# Patient Record
Sex: Male | Born: 1990 | Race: Black or African American | Hispanic: No | Marital: Single | Smoking: Current every day smoker
Health system: Southern US, Community
[De-identification: ages and names within clinical notes are randomized; demographics above are authoritative.]

## PROBLEM LIST (undated history)

## (undated) DIAGNOSIS — I10 Essential (primary) hypertension: Secondary | ICD-10-CM

---
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
## 2012-04-12 ENCOUNTER — Emergency Department: Payer: Self-pay | Admitting: Emergency Medicine

## 2016-08-16 ENCOUNTER — Ambulatory Visit (HOSPITAL_COMMUNITY)
Admission: EM | Admit: 2016-08-16 | Discharge: 2016-08-16 | Disposition: A | Discharge status: Home / Self Care | Payer: Self-pay | Attending: Internal Medicine | Admitting: Internal Medicine

## 2016-08-16 ENCOUNTER — Encounter (HOSPITAL_COMMUNITY): Payer: Self-pay | Admitting: Emergency Medicine

## 2016-08-16 DIAGNOSIS — J019 Acute sinusitis, unspecified: Secondary | ICD-10-CM

## 2016-08-16 HISTORY — DX: Essential (primary) hypertension: I10

## 2016-08-16 MED ORDER — PREDNISONE 50 MG PO TABS: 50.0000 mg | ORAL_TABLET | Freq: Every day | ORAL | 0 refills | Status: DC

## 2016-08-16 MED ORDER — AMOXICILLIN-POT CLAVULANATE 875-125 MG PO TABS: 1.0000 | ORAL_TABLET | Freq: Two times a day (BID) | ORAL | 0 refills | Status: AC

## 2016-08-16 NOTE — ED Provider Notes (Signed)
[facility]    CSN: [id number] Arrival date & time: 08/16/16  1655     History   Chief Complaint Chief Complaint  Patient presents with  . Dizziness    HPI Donald Hopkins is a 25 y.o. male.  He presents today with a two-week history of cough, mostly dry. In the last 24 hours he has developed headache, sensation of being off balance, and has vomited twice. He is not having diarrhea. No runny/congested nose, no sore throat. No fever. 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 Hypertension     History reviewed. No pertinent surgical history.     Home Medications    Prior to Admission medications   Medication Sig Start Date End Date Taking? Authorizing Provider  LISINOPRIL PO Take by mouth.   Yes Historical Provider, MD  amoxicillin-clavulanate (AUGMENTIN) 875-125 MG tablet Take 1 tablet by mouth 2 (two) times daily. 08/16/16 08/26/16  Eustace MooreLaura W Haset [place], MD  predniSONE (DELTASONE) 50 MG tablet Take 1 tablet (50 mg total) by mouth daily. 08/16/16   Eustace MooreLaura W Mason [place]leigh, MD    Family History History reviewed. No pertinent family history.  Social History Social History  Substance Use Topics  . Smoking status: Never Smoker  . Smokeless tobacco: Never Used  . Alcohol use No     Allergies   Patient has no known allergies.   Review of Systems Review of Systems  All other systems reviewed and are negative.    Physical Exam Triage Vital Signs ED Triage Vitals  Enc Vitals Group     BP 08/16/16 1723 125/70     Pulse Rate 08/16/16 1723 89     Resp 08/16/16 1723 18     Temp 08/16/16 1723 99.4 F (37.4 C)     Temp Source 08/16/16 1723 Oral     SpO2 08/16/16 1723 100 %     Weight --      Height --      Pain Score 08/16/16 1725 4   Updated Vital Signs BP 125/70 (BP Location: Left Arm)   Pulse 89   Temp 99.4 F (37.4 C) (Oral)   Resp 18   SpO2 100%  Physical Exam  Constitutional: He is oriented to person, place, and time.  No distress.  Alert, nicely groomed  HENT:  Head: Atraumatic.  Bilateral TMs are quite dull, left TM is red. Marked nasal congestion, particularly on the left Throat is quite red with postnasal drainage evident  Eyes:  Conjugate gaze, no eye redness/drainage  Neck: Neck supple.  Cardiovascular: Normal rate and regular rhythm.   Pulmonary/Chest: No respiratory distress. He has no wheezes. He has no rales.  Coarse but symmetric breath sounds throughout  Abdominal: He exhibits no distension.  Musculoskeletal: Normal range of motion.  Neurological: He is alert and oriented to person, place, and time.  Skin: Skin is warm and dry.  No cyanosis  Nursing note and vitals reviewed.    UC Treatments / Results   Procedures Procedures (including critical care time) None today  Final Clinical Impressions(s) / UC Diagnoses   Final diagnoses:  Acute sinusitis with symptoms > 10 days   Prescriptions for prednisone (for congestion/dizziness) and augmentin (antibiotic) were sent to the [facility] on [address].  Recheck for increasing phlegm production/nasal drainage or new fever >100.5.  Anticipate gradual improvement over the next several days.  Cough may persist for another couple weeks but should be improving.  New Prescriptions Discharge Medication List as of 08/16/2016  6:27 PM    START taking these medications   Details  amoxicillin-clavulanate (AUGMENTIN) 875-125 MG tablet Take 1 tablet by mouth 2 (two) times daily., Starting Sat 08/16/2016, Until Tue 08/26/2016, Normal    predniSONE (DELTASONE) 50 MG tablet Take 1 tablet (50 mg total) by mouth daily., Starting Sat 08/16/2016, Normal         Eustace MooreLaura W Christiane Sistare, MD 08/18/16 [phone]

## 2016-08-16 NOTE — ED Triage Notes (Signed)
Pt c/o feeling dizziness and nauseas onset this am associated w/HA  Believes it might be r/t to HTN... Taking lisinopril daily  A&O x4... NAD

## 2016-08-16 NOTE — Discharge Instructions (Addendum)
Prescriptions for prednisone (for congestion/dizziness) and augmentin (antibiotic) were sent to the [facility] on [address].  Recheck for increasing phlegm production/nasal drainage or new fever >100.5.  Anticipate gradual improvement over the next several days.  Cough may persist for another couple weeks but should be improving.

## 2017-05-13 ENCOUNTER — Emergency Department (HOSPITAL_BASED_OUTPATIENT_CLINIC_OR_DEPARTMENT_OTHER)
Admission: EM | Admit: 2017-05-13 | Discharge: 2017-05-13 | Disposition: A | Discharge status: Home / Self Care | Payer: Self-pay | Attending: Emergency Medicine | Admitting: Emergency Medicine

## 2017-05-13 ENCOUNTER — Encounter (HOSPITAL_BASED_OUTPATIENT_CLINIC_OR_DEPARTMENT_OTHER): Payer: Self-pay

## 2017-05-13 DIAGNOSIS — I10 Essential (primary) hypertension: Secondary | ICD-10-CM | POA: Insufficient documentation

## 2017-05-13 DIAGNOSIS — M25562 Pain in left knee: Secondary | ICD-10-CM | POA: Insufficient documentation

## 2017-05-13 MED ORDER — OXYCODONE HCL 5 MG PO TABS
5.0000 mg | ORAL_TABLET | Freq: Once | ORAL | Status: AC
  Administered 2017-05-13: 5 mg via ORAL
  Filled 2017-05-13: qty 1

## 2017-05-13 MED ORDER — ACETAMINOPHEN 500 MG PO TABS
1000.0000 mg | ORAL_TABLET | Freq: Once | ORAL | Status: AC
  Administered 2017-05-13: 1000 mg via ORAL
  Filled 2017-05-13: qty 2

## 2017-05-13 MED ORDER — KETOROLAC TROMETHAMINE 60 MG/2ML IM SOLN
15.0000 mg | Freq: Once | INTRAMUSCULAR | Status: AC
  Administered 2017-05-13: 15 mg via INTRAMUSCULAR
  Filled 2017-05-13: qty 2

## 2017-05-13 NOTE — ED Notes (Signed)
PMS intact before and after. Pt tolerated well. All questions answered. 

## 2017-05-13 NOTE — Discharge Instructions (Signed)
Take 4 over the counter ibuprofen tablets 3 times a day or 2 over-the-counter naproxen tablets twice a day for pain. Also take tylenol 1000mg(2 extra strength) four times a day.    

## 2017-05-13 NOTE — ED Triage Notes (Signed)
C/o pain/swelling to left knee x 1-2 weeks-hx of chronic pain since childhood-denies injury-NAD-steady gait

## 2017-05-13 NOTE — ED Provider Notes (Signed)
[facility]   CSN: [id number] Arrival date & time: 05/13/17  1103     History   Chief Complaint Chief Complaint  Patient presents with  . Knee Pain    HPI Donald Hopkins is a 26 y.o. male.  26 yo M with a chief complaint of left knee pain. This been going on for the past couple weeks. He denies trauma. Denies fevers or chills. Has had a history since he was about 26 years old where he would have spontaneous knee pain. Has never been diagnosed with anything. Pain is described as sharp and nonradiating. Has had difficulty walking on it.   The history is provided by the patient and a relative.  Knee Pain   This is a chronic problem. The current episode started more than 1 week ago. The problem occurs constantly. The problem has been gradually worsening. The pain is present in the left knee. The quality of the pain is described as sharp. The pain is at a severity of 8/10. The pain is severe. Associated symptoms include limited range of motion. He has tried nothing for the symptoms. The treatment provided no relief.    Past Medical History:  Diagnosis Date  . Hypertension     There are no active problems to display for this patient.   History reviewed. No pertinent surgical history.     Home Medications    Prior to Admission medications   Not on File    Family History No family history on file.  Social History Social History  Substance Use Topics  . Smoking status: Never Smoker  . Smokeless tobacco: Never Used  . Alcohol use No     Allergies   Patient has no known allergies.   Review of Systems Review of Systems  Constitutional: Negative for chills and fever.  HENT: Negative for congestion and facial swelling.   Eyes: Negative for discharge and visual disturbance.  Respiratory: Negative for shortness of breath.   Cardiovascular: Negative for chest pain and palpitations.  Gastrointestinal: Negative for abdominal pain, diarrhea and  vomiting.  Musculoskeletal: Positive for arthralgias, gait problem and myalgias.  Skin: Negative for color change and rash.  Neurological: Negative for tremors, syncope and headaches.  Psychiatric/Behavioral: Negative for confusion and dysphoric mood.     Physical Exam Updated Vital Signs BP (!) 151/99 (BP Location: Left Arm)   Pulse 97   Temp 98.1 F (36.7 C) (Oral)   Resp 16   Ht 6\' 1"  (1.854 m)   Wt 122.5 kg (270 lb)   SpO2 99%   BMI 35.62 kg/m   Physical Exam  Constitutional: He is oriented to person, place, and time. He appears well-developed and well-nourished.  HENT:  Head: Normocephalic and atraumatic.  Eyes: Pupils are equal, round, and reactive to light. EOM are normal.  Neck: Normal range of motion. Neck supple. No JVD present.  Cardiovascular: Normal rate and regular rhythm.  Exam reveals no gallop and no friction rub.   No murmur heard. Pulmonary/Chest: No respiratory distress. He has no wheezes.  Abdominal: He exhibits no distension and no mass. There is no tenderness. There is no rebound and no guarding.  Musculoskeletal: He exhibits edema and tenderness.  Mild pain with ROM.  PMS intact distally. Edema without significant erythema or warmth.   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  Skin: No rash noted. No pallor.  Psychiatric:  He has a normal mood and affect. His behavior is normal.  Nursing note and vitals reviewed.    ED Treatments / Results  Labs (all labs ordered are listed, but only abnormal results are displayed) Labs Reviewed - No data to display  EKG  EKG Interpretation None       Radiology No results found.  Procedures Procedures (including critical care time)  Medications Ordered in ED Medications  ketorolac (TORADOL) injection 15 mg (not administered)  acetaminophen (TYLENOL) tablet 1,000 mg (1,000 mg Oral Given 05/13/17 1132)  oxyCODONE (Oxy  IR/ROXICODONE) immediate release tablet 5 mg (5 mg Oral Given 05/13/17 1132)     Initial Impression / Assessment and Plan / ED Course  I have reviewed the triage vital signs and the nursing notes.  Pertinent labs & imaging results that were available during my care of the patient were reviewed by me and considered in my medical decision making (see chart for details).     26 yo M With a chief complaint of left knee pain. Going on for the past couple weeks. There is some significant edema but I'm able to range it without much difficulty. I doubt septic arthritis at this time. We'll place in a knee immobilizer. Crutches. Ortho Follow-up.  11:33 AM:  I have discussed the diagnosis/risks/treatment options with the patient and family and believe the pt to be eligible for discharge home to follow-up with PCP. We also discussed returning to the ED immediately if new or worsening sx occur. We discussed the sx which are most concerning (e.g., sudden worsening pain, fever, inability to tolerate by mouth) that necessitate immediate return. Medications administered to the patient during their visit and any new prescriptions provided to the patient are listed below.  Medications given during this visit Medications  ketorolac (TORADOL) injection 15 mg (not administered)  acetaminophen (TYLENOL) tablet 1,000 mg (1,000 mg Oral Given 05/13/17 1132)  oxyCODONE (Oxy IR/ROXICODONE) immediate release tablet 5 mg (5 mg Oral Given 05/13/17 1132)     The patient appears reasonably screen and/or stabilized for discharge and I doubt any other medical condition or other [facility] requiring further screening, evaluation, or treatment in the ED at this time prior to discharge.    Final Clinical Impressions(s) / ED Diagnoses   Final diagnoses:  Acute pain of left knee    New Prescriptions New Prescriptions   No medications on file     Melene Plan, DO 05/13/17 1133

## 2017-05-17 ENCOUNTER — Emergency Department (HOSPITAL_BASED_OUTPATIENT_CLINIC_OR_DEPARTMENT_OTHER): Payer: Self-pay

## 2017-05-17 ENCOUNTER — Encounter (HOSPITAL_BASED_OUTPATIENT_CLINIC_OR_DEPARTMENT_OTHER): Payer: Self-pay | Admitting: Emergency Medicine

## 2017-05-17 ENCOUNTER — Emergency Department (HOSPITAL_BASED_OUTPATIENT_CLINIC_OR_DEPARTMENT_OTHER)
Admission: EM | Admit: 2017-05-17 | Discharge: 2017-05-17 | Disposition: A | Discharge status: Home / Self Care | Payer: Self-pay | Attending: Emergency Medicine | Admitting: Emergency Medicine

## 2017-05-17 DIAGNOSIS — I10 Essential (primary) hypertension: Secondary | ICD-10-CM | POA: Insufficient documentation

## 2017-05-17 DIAGNOSIS — M25562 Pain in left knee: Secondary | ICD-10-CM | POA: Insufficient documentation

## 2017-05-17 MED ORDER — TRAMADOL HCL 50 MG PO TABS: 50.0000 mg | ORAL_TABLET | Freq: Four times a day (QID) | ORAL | 0 refills | Status: DC | PRN

## 2017-05-17 MED ORDER — PREDNISONE 50 MG PO TABS: 50.0000 mg | ORAL_TABLET | Freq: Every day | ORAL | 0 refills | Status: DC

## 2017-05-17 NOTE — Discharge Instructions (Signed)
Return here as needed.  Follow-up with the orthopedist provided.  Ice and elevate your knee °

## 2017-05-17 NOTE — ED Triage Notes (Signed)
Patient follows up from 3 days ago-reports pain in left knee has gotten worse.

## 2017-05-18 NOTE — ED Provider Notes (Signed)
[facility]   CSN: [id number] Arrival date & time: 05/17/17  1036     History   Chief Complaint Chief Complaint  Patient presents with  . Knee Pain    HPI Donald Hopkins is a 25 y.o. male.  HPI Patient presents to the emergency department with knee pain over the last week.  Patient states he was seen 4 days ago and was placed in a knee immobilizer and told to follow with orthopedics.  Patient states that he did not have any injury to the knee.  States the pain is worse with movement and palpation.  Patient states that he did not take any medications prior to arrival.  Patient denies numbness or weakness in the leg.  The patient also denies fever Past Medical History:  Diagnosis Date  . Hypertension     There are no active problems to display for this patient.   History reviewed. No pertinent surgical history.     Home Medications    Prior to Admission medications   Medication Sig Start Date End Date Taking? Authorizing Provider  predniSONE (DELTASONE) 50 MG tablet Take 1 tablet (50 mg total) by mouth daily. 05/17/17   Loanne Emery, Cristal Deer, PA-C  traMADol (ULTRAM) 50 MG tablet Take 1 tablet (50 mg total) by mouth every 6 (six) hours as needed for severe pain. 05/17/17   Charlestine Night, PA-C    Family History History reviewed. No pertinent family history.  Social History Social History  Substance Use Topics  . Smoking status: Never Smoker  . Smokeless tobacco: Never Used  . Alcohol use No     Allergies   Patient has no known allergies.   Review of Systems Review of Systems  All other systems negative except as documented in the HPI. All pertinent positives and negatives as reviewed in the HPI. Physical Exam Updated Vital Signs BP 131/76   Pulse 69   Temp 99.1 F (37.3 C) (Oral)   Resp 18   Ht 6\' 1"  (1.854 m)   Wt 122.5 kg (270 lb)   SpO2 99%   BMI 35.62 kg/m   Physical Exam  Constitutional: He is oriented to person, place,  and time. He appears well-developed and well-nourished. No distress.  HENT:  Head: Normocephalic and atraumatic.  Eyes: Pupils are equal, round, and reactive to light.  Pulmonary/Chest: Effort normal.  Musculoskeletal:       Left knee: He exhibits swelling. He exhibits no deformity, no laceration and no erythema. Tenderness found.       Legs: Neurological: He is alert and oriented to person, place, and time.  Skin: Skin is warm and dry.  Psychiatric: He has a normal mood and affect.  Nursing note and vitals reviewed.    ED Treatments / Results  Labs (all labs ordered are listed, but only abnormal results are displayed) Labs Reviewed - No data to display  EKG  EKG Interpretation None       Radiology Dg Knee Complete 4 Views Left  Result Date: 05/17/2017 CLINICAL DATA:  Chronic left knee pain EXAM: LEFT KNEE - COMPLETE 4+ VIEW COMPARISON:  None. FINDINGS: No fracture or dislocation is seen. The joint spaces are preserved. The visualized soft tissues are unremarkable. IMPRESSION: Negative. Electronically Signed   By: Charline Bills M.D.   On: 05/17/2017 11:48    Procedures Procedures (including critical care time)  Medications Ordered in ED Medications - No data to display   Initial Impression / Assessment and Plan / ED  Course  I have reviewed the triage vital signs and the nursing notes.  Pertinent labs & imaging results that were available during my care of the patient were reviewed by me and considered in my medical decision making (see chart for details).     Do not feel that this is a septic joint.  I do feel this could represent an inflammatory process.  We will treat with medications that have a follow-up with orthopedics.  Told ice and elevate the knee.  The knee does not have any signs of abnormality on x-ray.  Patient agrees the plan and all questions were answered  Final Clinical Impressions(s) / ED Diagnoses   Final diagnoses:  Acute pain of left knee      New Prescriptions Discharge Medication List as of 05/17/2017  1:35 PM    START taking these medications   Details  predniSONE (DELTASONE) 50 MG tablet Take 1 tablet (50 mg total) by mouth daily., Starting Sun 05/17/2017, Print    traMADol (ULTRAM) 50 MG tablet Take 1 tablet (50 mg total) by mouth every 6 (six) hours as needed for severe pain., Starting Sun 05/17/2017, Print         Capria Cartaya, [place], PA-C 05/18/17 1711    Alvira Monday, MD 05/18/17 2322

## 2017-12-30 ENCOUNTER — Encounter (HOSPITAL_BASED_OUTPATIENT_CLINIC_OR_DEPARTMENT_OTHER): Payer: Self-pay | Admitting: Emergency Medicine

## 2017-12-30 ENCOUNTER — Other Ambulatory Visit: Payer: Self-pay

## 2017-12-30 ENCOUNTER — Emergency Department (HOSPITAL_BASED_OUTPATIENT_CLINIC_OR_DEPARTMENT_OTHER)
Admission: EM | Admit: 2017-12-30 | Discharge: 2017-12-30 | Disposition: A | Discharge status: Home / Self Care | Payer: Self-pay | Attending: Emergency Medicine | Admitting: Emergency Medicine

## 2017-12-30 ENCOUNTER — Emergency Department (HOSPITAL_BASED_OUTPATIENT_CLINIC_OR_DEPARTMENT_OTHER): Payer: Self-pay

## 2017-12-30 DIAGNOSIS — Y999 Unspecified external cause status: Secondary | ICD-10-CM | POA: Insufficient documentation

## 2017-12-30 DIAGNOSIS — W19XXXA Unspecified fall, initial encounter: Secondary | ICD-10-CM | POA: Insufficient documentation

## 2017-12-30 DIAGNOSIS — F17210 Nicotine dependence, cigarettes, uncomplicated: Secondary | ICD-10-CM | POA: Insufficient documentation

## 2017-12-30 DIAGNOSIS — I10 Essential (primary) hypertension: Secondary | ICD-10-CM | POA: Insufficient documentation

## 2017-12-30 DIAGNOSIS — S8392XA Sprain of unspecified site of left knee, initial encounter: Secondary | ICD-10-CM | POA: Insufficient documentation

## 2017-12-30 DIAGNOSIS — Y929 Unspecified place or not applicable: Secondary | ICD-10-CM | POA: Insufficient documentation

## 2017-12-30 DIAGNOSIS — Y939 Activity, unspecified: Secondary | ICD-10-CM | POA: Insufficient documentation

## 2017-12-30 MED ORDER — IBUPROFEN 800 MG PO TABS: 800.0000 mg | ORAL_TABLET | Freq: Three times a day (TID) | ORAL | 0 refills | Status: AC

## 2017-12-30 NOTE — ED Triage Notes (Signed)
Pt states he banged his L knee this morning on a piece of steel causing pain and difficulty ambulating.

## 2017-12-30 NOTE — ED Provider Notes (Signed)
[facility] EM[facility]ENC[facility] DEPARTMENT Provider Note   CSN: [id number] Arrival date & time: 12/30/17  0847     History   Chief Complaint Chief Complaint  Patient presents with  . Knee Injury    HPI Donald Hopkins is a 27 y.o. male.  Knee 'gave out' which it apparently has done multiple times in the past and he feel on  His left patella causing pain and swelling.   The history is provided by the patient.  Knee Pain   This is a new problem. The current episode started 1 to 2 hours ago. The problem occurs constantly. The problem has not changed since onset.The pain is present in the left knee. The quality of the pain is described as sharp and aching. The pain is mild. Associated symptoms include stiffness. He has tried nothing for the symptoms. The treatment provided no relief.    Past Medical History:  Diagnosis Date  . Hypertension     There are no active problems to display for this patient.   History reviewed. No pertinent surgical history.      Home Medications    Prior to Admission medications   Medication Sig Start Date End Date Taking? Authorizing Provider  ibuprofen (ADVIL,MOTRIN) 800 MG tablet Take 1 tablet (800 mg total) by mouth 3 (three) times daily. 12/30/17   Nicolaus Andel, Barbara CowerJason, MD    Family History No family history on file.  Social History Social History   Tobacco Use  . Smoking status: Current Every Day Smoker  . Smokeless tobacco: Never Used  Substance Use Topics  . Alcohol use: No  . Drug use: No     Allergies   Patient has no known allergies.   Review of Systems Review of Systems  Musculoskeletal: Positive for stiffness.  All other systems reviewed and are negative.    Physical Exam Updated Vital Signs BP 131/73 (BP Location: Right Arm)   Pulse 88   Temp 98.2 F (36.8 C) (Oral)   Resp 18   Ht 5\' 8"  (1.727 m)   Wt 131.5 kg (290 lb)   SpO2 97%   BMI 44.09 kg/m   Physical Exam  Constitutional: He appears well-developed  and well-nourished.  HENT:  Head: Normocephalic and atraumatic.  Eyes: Conjunctivae and EOM are normal.  Neck: Normal range of motion.  Cardiovascular: Normal rate.  Pulmonary/Chest: Effort normal. No respiratory distress.  Abdominal: Soft. Bowel sounds are normal. He exhibits no distension.  Musculoskeletal: Normal range of motion. He exhibits edema (mild in left knee) and tenderness (left patella). He exhibits no deformity.  Neurological: He is alert.  Skin: Skin is warm and dry.  Nursing note and vitals reviewed.    ED Treatments / Results  Labs (all labs ordered are listed, but only abnormal results are displayed) Labs Reviewed - No data to display  EKG None  Radiology Dg Knee Complete 4 Views Left  Result Date: 12/30/2017 CLINICAL DATA:  27 year old male status post fall on to left knee with pain and decreased range of motion. EXAM: LEFT KNEE - COMPLETE 4+ VIEW COMPARISON:  Left knee series 05/17/2017. FINDINGS: Bone mineralization is within normal limits. Joint spaces and alignment are stable and within normal limits. No evidence of fracture, dislocation, or joint effusion. No evidence of arthropathy or other focal bone abnormality. Soft tissues are unremarkable. IMPRESSION: Stable and negative. Electronically Signed   By: Odessa FlemingH  Hall M.D.   On: 12/30/2017 09:33    Procedures Procedures (including critical care time)  Medications Ordered in ED Medications - No data to display   Initial Impression / Assessment and Plan / ED Course  I have reviewed the triage vital signs and the nursing notes.  Pertinent labs & imaging results that were available during my care of the patient were reviewed by me and considered in my medical decision making (see chart for details).     No obvious traumatic injury today. Suspect possible ligamental issue 2/2 'giving out' and other chronic problems he has had. Will refer to orthopedics for that issue, otherwise RICE therapy for traumatic knee.    Final Clinical Impressions(s) / ED Diagnoses   Final diagnoses:  Sprain of left knee, unspecified ligament, initial encounter    ED Discharge Orders        Ordered    ibuprofen (ADVIL,MOTRIN) 800 MG tablet  3 times daily     12/30/17 0942       Ahjanae Cassel, Barbara Cower, MD 12/30/17 [phone]

## 2017-12-30 NOTE — ED Notes (Signed)
Patient transported to X-ray 

## 2018-05-16 ENCOUNTER — Emergency Department (HOSPITAL_BASED_OUTPATIENT_CLINIC_OR_DEPARTMENT_OTHER)
Admission: EM | Admit: 2018-05-16 | Discharge: 2018-05-16 | Disposition: A | Discharge status: Home / Self Care | Payer: Self-pay | Attending: Emergency Medicine | Admitting: Emergency Medicine

## 2018-05-16 ENCOUNTER — Encounter (HOSPITAL_BASED_OUTPATIENT_CLINIC_OR_DEPARTMENT_OTHER): Payer: Self-pay | Admitting: *Deleted

## 2018-05-16 ENCOUNTER — Other Ambulatory Visit: Payer: Self-pay

## 2018-05-16 ENCOUNTER — Emergency Department (HOSPITAL_BASED_OUTPATIENT_CLINIC_OR_DEPARTMENT_OTHER): Payer: Self-pay

## 2018-05-16 DIAGNOSIS — R0789 Other chest pain: Secondary | ICD-10-CM | POA: Insufficient documentation

## 2018-05-16 DIAGNOSIS — J069 Acute upper respiratory infection, unspecified: Secondary | ICD-10-CM | POA: Insufficient documentation

## 2018-05-16 DIAGNOSIS — F17210 Nicotine dependence, cigarettes, uncomplicated: Secondary | ICD-10-CM | POA: Insufficient documentation

## 2018-05-16 DIAGNOSIS — I10 Essential (primary) hypertension: Secondary | ICD-10-CM | POA: Insufficient documentation

## 2018-05-16 NOTE — Discharge Instructions (Addendum)
1.  You should have a family doctor.  Use the referral number in your discharge instructions to find one. 2.  You should monitor your blood pressure closely to make sure that you are in the normal range.  This should be done with the assistance of a physician.  For your overall health, quit smoking, have a healthy diet and get regular exercise.

## 2018-05-16 NOTE — ED Provider Notes (Signed)
[facility] EMERGENCY DEPARTMENT Provider Note   CSN: [id number] Arrival date & time: 05/16/18  2144     History   Chief Complaint Chief Complaint  Patient presents with  . Chest Pain    HPI Donald Hopkins is a 27 y.o. male.  HPI Patient reports he has had a cold for several days.  He has had some nasal congestion and drainage and sore throat.  No fever that he is aware of.  Tonight he got several sharp chest pains in the center of his chest.  Made him very worried.  He came to make sure there was nothing wrong with his heart.  She does smoke.  He reports he had a history of asthma as a child but seems to have outgrown it.  He has not needed an inhaler had wheezing for a long time. no Lower extremity swelling or calf pain.  Patient reports he has hypertension but does not take medications.  He reports sometimes these have really high numbers but he cannot remember when.  He does not get it routinely checked. Past Medical History:  Diagnosis Date  . Hypertension     There are no active problems to display for this patient.   History reviewed. No pertinent surgical history.      Home Medications    Prior to Admission medications   Medication Sig Start Date End Date Taking? Authorizing Provider  ibuprofen (ADVIL,MOTRIN) 800 MG tablet Take 1 tablet (800 mg total) by mouth 3 (three) times daily. 12/30/17   Mesner, Barbara Cower, MD    Family History No family history on file.  Social History Social History   Tobacco Use  . Smoking status: Current Every Day Smoker    Types: Cigarettes  . Smokeless tobacco: Never Used  Substance Use Topics  . Alcohol use: No  . Drug use: No     Allergies   Patient has no known allergies.   Review of Systems Review of Systems 10 Systems reviewed and are negative for acute change except as noted in the HPI.   Physical Exam Updated Vital Signs BP (!) 144/100   Pulse 78   Resp 19   Ht 5\' 7"  (1.702 m)   Wt 127 kg   SpO2 100%    BMI 43.85 kg/m   Physical Exam  Constitutional: He is oriented to person, place, and time. He appears well-developed and well-nourished.  HENT:  Head: Normocephalic and atraumatic.  Nose: Nose normal.  Mouth/Throat: Oropharynx is clear and moist.  Lateral TMs normal.  Eyes: Pupils are equal, round, and reactive to light. EOM are normal.  Neck: Neck supple.  Cardiovascular: Normal rate, regular rhythm, normal heart sounds and intact distal pulses.  Pulmonary/Chest: Effort normal and breath sounds normal. He exhibits tenderness.  Patient winces with compression of the sternum.  Abdominal: Soft. Bowel sounds are normal. He exhibits no distension. There is no tenderness.  Musculoskeletal: Normal range of motion. He exhibits no edema.  Neurological: He is alert and oriented to person, place, and time. He has normal strength. Coordination normal. GCS eye subscore is 4. GCS verbal subscore is 5. GCS motor subscore is 6.  Skin: Skin is warm, dry and intact.  Psychiatric: He has a normal mood and affect.     ED Treatments / Results  Labs (all labs ordered are listed, but only abnormal results are displayed) Labs Reviewed - No data to display  EKG EKG Interpretation  Date/Time:  Sunday May 16 2018 21:50:07 EDT  Ventricular Rate:  85 PR Interval:    QRS Duration: 95 QT Interval:  366 QTC Calculation: 436 R Axis:   30 Text Interpretation:  Sinus rhythm Probable left atrial enlargement ST elev, probable normal early repol pattern agree. normal. no old comparison Confirmed by Arby BarrettePfeiffer, Nathasha Fiorillo [phone]([id number]) on 05/16/2018 11:00:48 PM   Radiology Dg Chest 2 View  Result Date: 05/16/2018 CLINICAL DATA:  Cold.  Chest pain. EXAM: CHEST - 2 VIEW COMPARISON:  None. FINDINGS: Heart and mediastinal contours are within normal limits. No focal opacities or effusions. No acute bony abnormality. IMPRESSION: No active cardiopulmonary disease. Electronically Signed   By: Charlett NoseKevin  Dover M.D.   On: 05/16/2018  22:57    Procedures Procedures (including critical care time)  Medications Ordered in ED Medications - No data to display   Initial Impression / Assessment and Plan / ED Course  I have reviewed the triage vital signs and the nursing notes.  Pertinent labs & imaging results that were available during my care of the patient were reviewed by me and considered in my medical decision making (see chart for details).      Final Clinical Impressions(s) / ED Diagnoses   Final diagnoses:  Chest wall pain  Viral upper respiratory tract infection   Patient is clinically well in appearance.  Has had some sharp central chest pains just prior to arrival.  Does have URI.  EEG and chest x-ray normal.  Patient reports he just came to make sure that there was nothing wrong with his heart.  Reports he feels fine otherwise.  He was counseled on hypertension in the importance of compliance with regular surveillance, diet, smoking cessation and exercise.  Review of EMR, shows that ED visits have had fairly normotensive numbers.  Does have diastolic hypertension documented in the emergency department today.  She is strongly counseled to be compliant with follow-up and close monitoring due to severe morbidities associated with long-term untreated hypertension. ED Discharge Orders    None       Arby BarrettePfeiffer, Abie Killian, MD 05/16/18 2312

## 2018-05-16 NOTE — ED Notes (Signed)
Pt left prior to reviewing d/c paperwork.

## 2018-05-16 NOTE — ED Triage Notes (Signed)
Pt reports cold Sx x 4-5 today. States tonight he has been having chest pain x 2-3 hours

## 2019-10-01 IMAGING — DX DG KNEE COMPLETE 4+V*L*
4 series · 4 of 4 positions shown · non-contrast
Comparison: Left knee series 05/17/2017.

CLINICAL DATA: 27-year-old male status post fall on to left knee
with pain and decreased range of motion.

EXAM:
LEFT KNEE - COMPLETE 4+ VIEW

[knee ap]
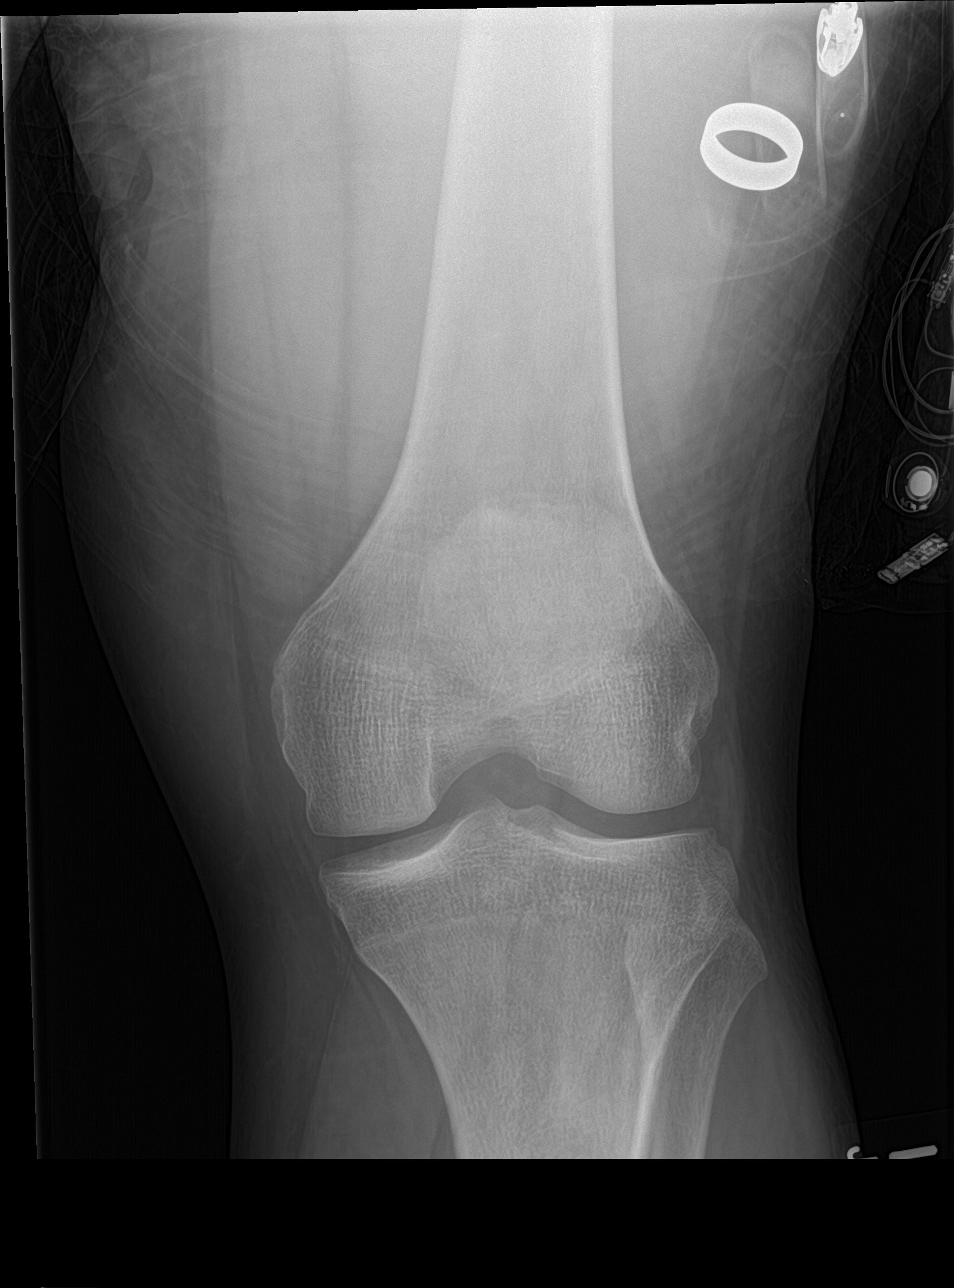

[knee lat]
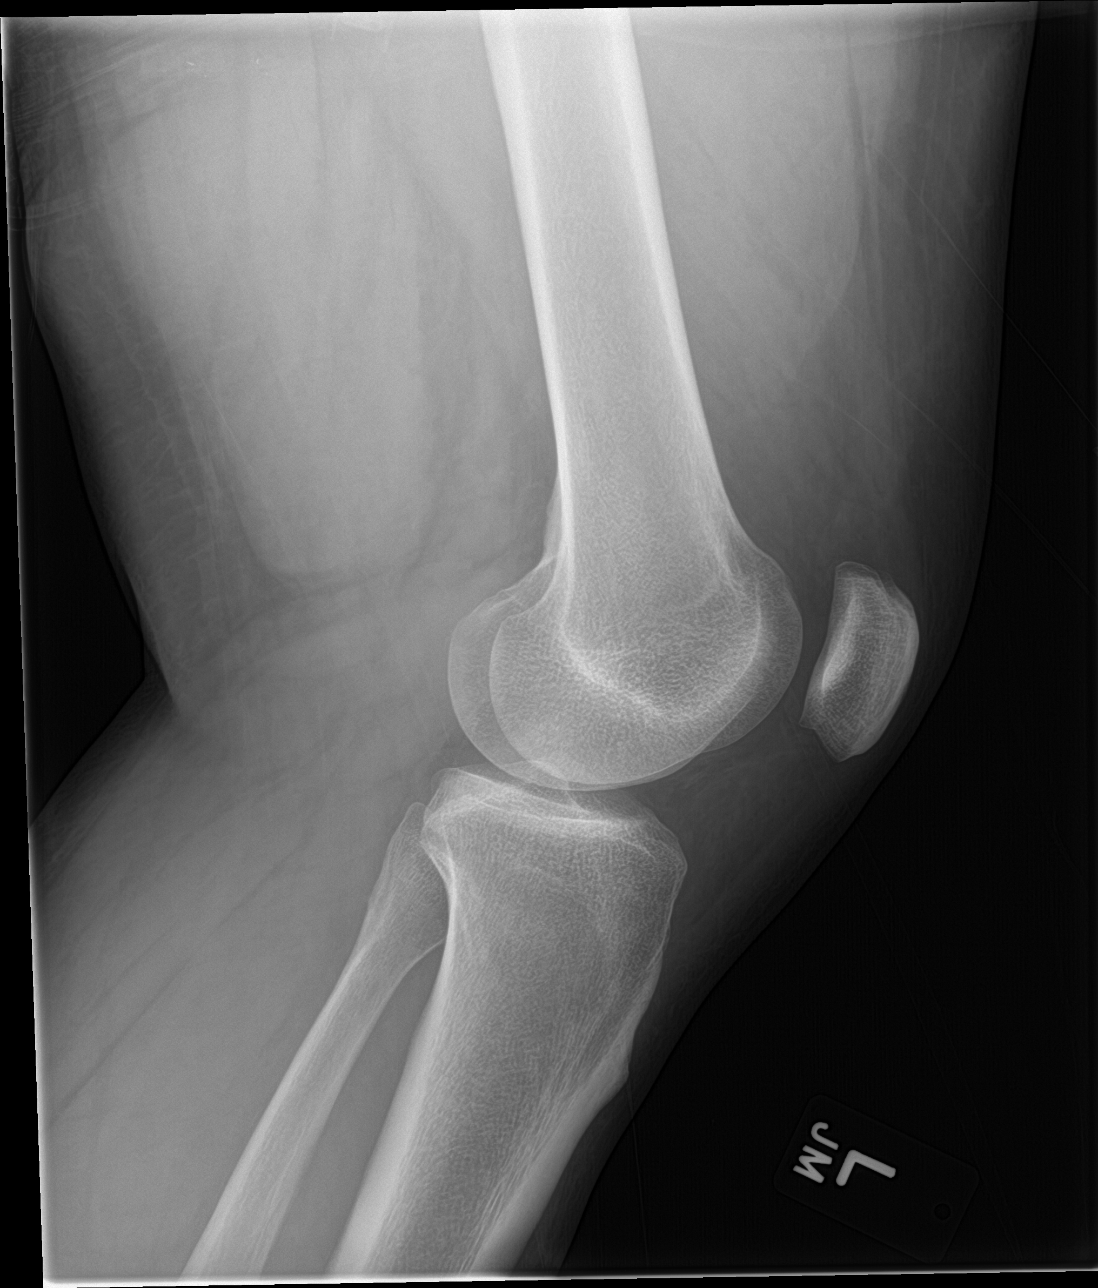

[knee obl (1 of 2)]
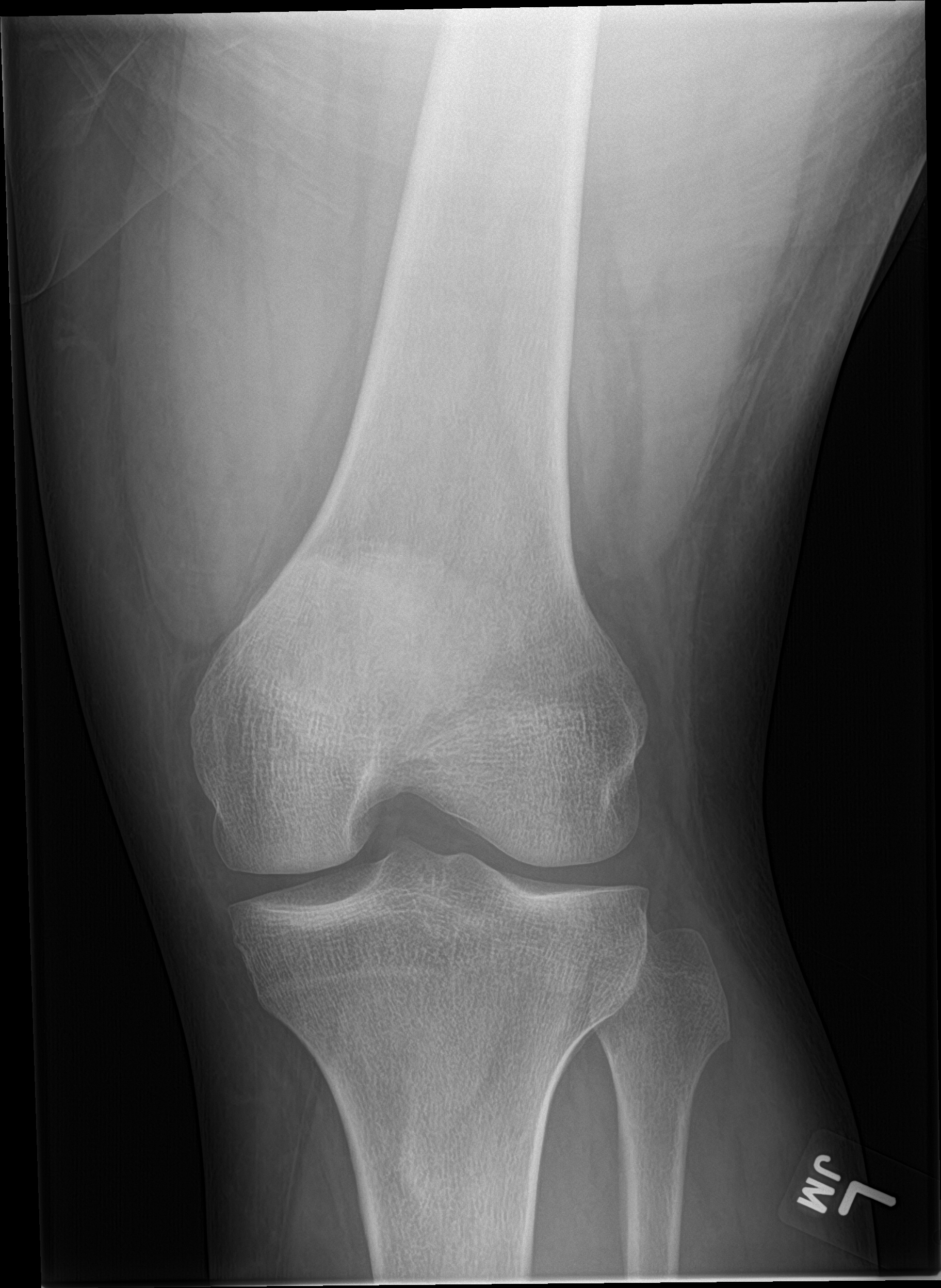

[knee obl (2 of 2)]
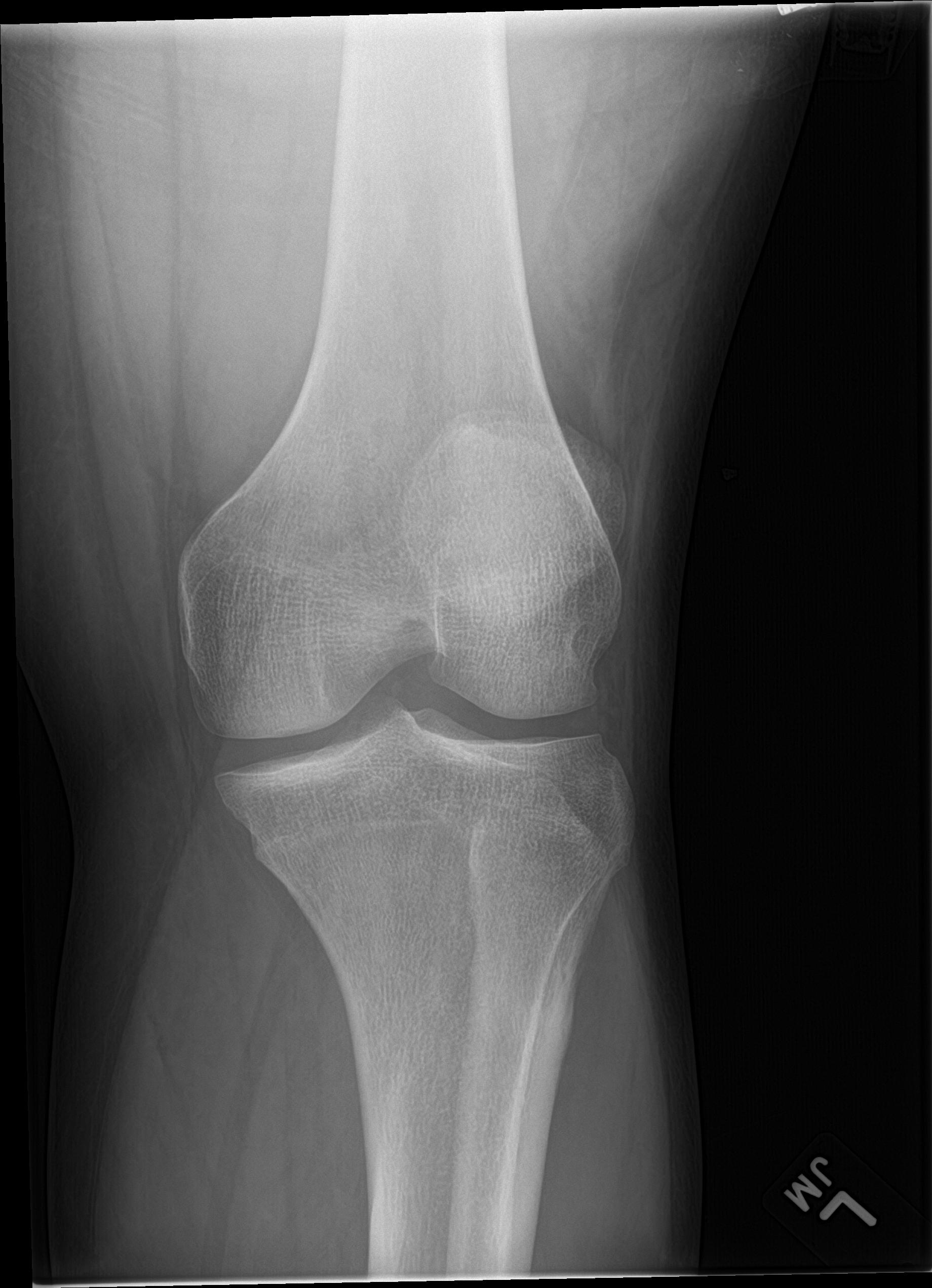

[4 of 4 positions shown; findings below may reference images not displayed]

FINDINGS: Bone mineralization is within normal limits. Joint spaces and
alignment are stable and within normal limits. No evidence of
fracture, dislocation, or joint effusion. No evidence of arthropathy
or other focal bone abnormality. Soft tissues are unremarkable.
IMPRESSION: Stable and negative.

## 2019-10-13 ENCOUNTER — Emergency Department (HOSPITAL_BASED_OUTPATIENT_CLINIC_OR_DEPARTMENT_OTHER): Admission: EM | Admit: 2019-10-13 | Discharge: 2019-10-13 | Discharge status: Against Medical Advice | Payer: Self-pay

## 2019-10-13 ENCOUNTER — Other Ambulatory Visit: Payer: Self-pay

## 2019-12-15 ENCOUNTER — Ambulatory Visit: Payer: Self-pay

## 2020-03-10 ENCOUNTER — Emergency Department (HOSPITAL_BASED_OUTPATIENT_CLINIC_OR_DEPARTMENT_OTHER): Admission: EM | Admit: 2020-03-10 | Discharge: 2020-03-10 | Discharge status: Against Medical Advice | Payer: Self-pay

## 2020-03-10 ENCOUNTER — Other Ambulatory Visit: Payer: Self-pay

## 2020-03-10 ENCOUNTER — Encounter (HOSPITAL_BASED_OUTPATIENT_CLINIC_OR_DEPARTMENT_OTHER): Payer: Self-pay

## 2020-03-10 ENCOUNTER — Emergency Department (HOSPITAL_BASED_OUTPATIENT_CLINIC_OR_DEPARTMENT_OTHER)
Admission: EM | Admit: 2020-03-10 | Discharge: 2020-03-10 | Disposition: A | Discharge status: Home / Self Care | Payer: Self-pay | Attending: Emergency Medicine | Admitting: Emergency Medicine

## 2020-03-10 DIAGNOSIS — Z5321 Procedure and treatment not carried out due to patient leaving prior to being seen by health care provider: Secondary | ICD-10-CM | POA: Insufficient documentation

## 2020-03-10 DIAGNOSIS — R109 Unspecified abdominal pain: Secondary | ICD-10-CM | POA: Insufficient documentation

## 2020-03-10 MED ORDER — ONDANSETRON 4 MG PO TBDP: 4.0000 mg | ORAL_TABLET | Freq: Once | ORAL | Status: DC | PRN

## 2020-03-10 NOTE — ED Notes (Signed)
Called pt for triage and no answer in lobby.

## 2020-03-10 NOTE — ED Triage Notes (Addendum)
Pt c/o nausea/vomiting that started around lunch today. Denies diarrhea. When asked pt stated he was not having abd pain. When asked again about pain pt states his abd pain is a "10"/10  Note pt has checked in twice today and left prior to being triaged. Pt is flighty in appearance.

## 2020-03-10 NOTE — ED Notes (Signed)
As this RN was attempting to obtain nausea medication for pt, he was putting his finger down his throat and trying to make himself throw up. Pt pacing and stating he is leaving again.

## 2022-03-21 ENCOUNTER — Emergency Department (HOSPITAL_BASED_OUTPATIENT_CLINIC_OR_DEPARTMENT_OTHER)
Admission: EM | Admit: 2022-03-21 | Discharge: 2022-03-21 | Disposition: A | Discharge status: Home / Self Care | Payer: Self-pay | Attending: Emergency Medicine | Admitting: Emergency Medicine

## 2022-03-21 ENCOUNTER — Encounter (HOSPITAL_BASED_OUTPATIENT_CLINIC_OR_DEPARTMENT_OTHER): Payer: Self-pay | Admitting: Emergency Medicine

## 2022-03-21 ENCOUNTER — Other Ambulatory Visit: Payer: Self-pay

## 2022-03-21 DIAGNOSIS — R112 Nausea with vomiting, unspecified: Secondary | ICD-10-CM

## 2022-03-21 DIAGNOSIS — I10 Essential (primary) hypertension: Secondary | ICD-10-CM | POA: Insufficient documentation

## 2022-03-21 DIAGNOSIS — K529 Noninfective gastroenteritis and colitis, unspecified: Secondary | ICD-10-CM | POA: Insufficient documentation

## 2022-03-21 LAB — URINALYSIS, ROUTINE W REFLEX MICROSCOPIC
Bilirubin Urine: NEGATIVE
Glucose, UA: NEGATIVE mg/dL
Hgb urine dipstick: NEGATIVE
Ketones, ur: 15 mg/dL — AB
Leukocytes,Ua: NEGATIVE
Nitrite: NEGATIVE
Protein, ur: NEGATIVE mg/dL
Specific Gravity, Urine: 1.025 (ref 1.005–1.030)
pH: 5.5 (ref 5.0–8.0)

## 2022-03-21 LAB — CBC
HCT: 42.3 % (ref 39.0–52.0)
Hemoglobin: 14.9 g/dL (ref 13.0–17.0)
MCH: 32.8 pg (ref 26.0–34.0)
MCHC: 35.2 g/dL (ref 30.0–36.0)
MCV: 93.2 fL (ref 80.0–100.0)
Platelets: 337 10*3/uL (ref 150–400)
RBC: 4.54 MIL/uL (ref 4.22–5.81)
RDW: 13.3 % (ref 11.5–15.5)
WBC: 13.7 10*3/uL — ABNORMAL HIGH (ref 4.0–10.5)
nRBC: 0 % (ref 0.0–0.2)

## 2022-03-21 LAB — COMPREHENSIVE METABOLIC PANEL
ALT: 45 U/L — ABNORMAL HIGH (ref 0–44)
AST: 44 U/L — ABNORMAL HIGH (ref 15–41)
Albumin: 4.2 g/dL (ref 3.5–5.0)
Alkaline Phosphatase: 68 U/L (ref 38–126)
Anion gap: 7 (ref 5–15)
BUN: 17 mg/dL (ref 6–20)
CO2: 25 mmol/L (ref 22–32)
Calcium: 9.1 mg/dL (ref 8.9–10.3)
Chloride: 104 mmol/L (ref 98–111)
Creatinine, Ser: 1.3 mg/dL — ABNORMAL HIGH (ref 0.61–1.24)
GFR, Estimated: >60 mL/min (ref >60)
Glucose, Bld: 110 mg/dL — ABNORMAL HIGH (ref 70–99)
Potassium: 3.8 mmol/L (ref 3.5–5.1)
Sodium: 136 mmol/L (ref 135–145)
Total Bilirubin: 0.8 mg/dL (ref 0.3–1.2)
Total Protein: 8.3 g/dL — ABNORMAL HIGH (ref 6.5–8.1)

## 2022-03-21 MED ORDER — SODIUM CHLORIDE 0.9 % IV BOLUS
1000.0000 mL | Freq: Once | INTRAVENOUS | Status: AC
  Administered 2022-03-21: 1000 mL via INTRAVENOUS

## 2022-03-21 MED ORDER — ONDANSETRON HCL 4 MG/2ML IJ SOLN
4.0000 mg | Freq: Once | INTRAMUSCULAR | Status: AC
Start: 2022-03-21 — End: 2022-03-21
  Administered 2022-03-21: 4 mg via INTRAVENOUS
  Filled 2022-03-21: qty 2

## 2022-03-21 MED ORDER — ONDANSETRON HCL 4 MG PO TABS: 4.0000 mg | ORAL_TABLET | Freq: Four times a day (QID) | ORAL | 0 refills | Status: AC

## 2022-03-21 NOTE — ED Triage Notes (Signed)
Patient presents to ED via POV from home. Patient reports generalized body cramping x 2-3 days. Reports 5-6 episodes of vomiting within 24 hours. Denies abdominal pain.

## 2022-03-21 NOTE — Discharge Instructions (Addendum)
You were seen emergency department today for body cramps and vomiting.  As we discussed I think your symptoms are like related to a viral illness.  We have given you IV fluids and a nausea medicine, and I am glad that you are feeling somewhat better.  Your lab work looked overall okay.  Your kidney function was slightly lower than I would like it to be, but this can happen with dehydration.  Make sure that you are hydrating well at home.  I have written you a prescription for nausea medicine that you can take every 6 hours as needed.  Once you are feeling up to eating, start with bland foods and slowly work your way up.  Continue to monitor how you are doing and return to the emergency department for any new or worsening symptoms such as fever, persistent vomiting despite medication, worsening cramps or pain.

## 2022-03-21 NOTE — ED Provider Notes (Signed)
[facility] EMERGENCY DEPARTMENT Provider Note   CSN: [id number] Arrival date & time: 03/21/22  0907     History  Chief Complaint  Patient presents with   Generalized Body Aches    Donald Hopkins is a 31 y.o. male with history of hypertension presents the emergency department complaining of generalized body cramping, and vomiting.  Patient states that symptoms started about 2 or 3 days ago.  He has been noticing cramps across his whole body, especially around his ribs that can make it difficult to breathe.  He is also had about 5-6 episodes of nonbloody nonbilious emesis within the past 24 hours.  Denies fever, diarrhea, urinary symptoms.  Denies any new medications or any recent travel.  Did not take his blood pressure medicine today.  HPI     Home Medications Prior to Admission medications   Medication Sig Start Date End Date Taking? Authorizing Provider  ondansetron (ZOFRAN) 4 MG tablet Take 1 tablet (4 mg total) by mouth every 6 (six) hours. 03/21/22  Yes Elfa Wooton T, PA-C  ibuprofen (ADVIL,MOTRIN) 800 MG tablet Take 1 tablet (800 mg total) by mouth 3 (three) times daily. 12/30/17   Mesner, Barbara Cower, MD      Allergies    Patient has no known allergies.    Review of Systems   Review of Systems  Constitutional:  Negative for fever.  Gastrointestinal:  Positive for nausea and vomiting. Negative for abdominal pain and diarrhea.  Genitourinary:  Negative for dysuria and hematuria.  Musculoskeletal:  Positive for myalgias.  All other systems reviewed and are negative.   Physical Exam Updated Vital Signs BP (!) 180/114 (BP Location: Right Arm)   Pulse 79   Temp 98 F (36.7 C) (Oral)   Resp 19   SpO2 99%  Physical Exam Vitals and nursing note reviewed.  Constitutional:      Appearance: Normal appearance.  HENT:     Head: Normocephalic and atraumatic.  Eyes:     Conjunctiva/sclera: Conjunctivae normal.  Cardiovascular:     Rate and Rhythm: Normal rate and  regular rhythm.  Pulmonary:     Effort: Pulmonary effort is normal. No respiratory distress.     Breath sounds: Normal breath sounds.  Abdominal:     General: There is no distension.     Palpations: Abdomen is soft.     Tenderness: There is generalized abdominal tenderness. There is no guarding or rebound.  Skin:    General: Skin is warm and dry.  Neurological:     General: No focal deficit present.     Mental Status: He is alert.     ED Results / Procedures / Treatments   Labs (all labs ordered are listed, but only abnormal results are displayed) Labs Reviewed  COMPREHENSIVE METABOLIC PANEL - Abnormal; Notable for the following components:      Result Value   Glucose, Bld 110 (*)    Creatinine, Ser 1.30 (*)    Total Protein 8.3 (*)    AST 44 (*)    ALT 45 (*)    All other components within normal limits  CBC - Abnormal; Notable for the following components:   WBC 13.7 (*)    All other components within normal limits  URINALYSIS, ROUTINE W REFLEX MICROSCOPIC - Abnormal; Notable for the following components:   Ketones, ur 15 (*)    All other components within normal limits    EKG None  Radiology No results found.  Procedures Procedures  Medications Ordered in ED Medications  sodium chloride 0.9 % bolus 1,000 mL (0 mLs Intravenous Stopped 03/21/22 1119)  ondansetron (ZOFRAN) injection 4 mg (4 mg Intravenous Given 03/21/22 1000)    ED Course/ Medical Decision Making/ A&P                           Medical Decision Making Amount and/or Complexity of Data Reviewed Labs: ordered.  Risk Prescription drug management.  Patient is a 31 year old male with a history of hypertension who presents the emergency department complaining of generalized body cramping and vomiting for the past 2 days.  On exam patient is hypertensive, consistent with not taking blood pressure medicine today.  Afebrile, not tachycardic.  In no acute distress.  Generalized abdominal tenderness  palpation, without guarding or rebound.  Abdomen soft, and overall nonsurgical.  I ordered and interpreted labs with the pertinent findings: Leukocytosis of 13.7, electrolytes grossly within normal limits.  Creatinine 1.3 (do not have prior to compare to).  Urinalysis negative for hematuria or infection.  I ordered medication including IV fluids and Zofran for vomiting and dehydration.  Reevaluation of the patient he states his symptoms have improved.  He passed a p.o. challenge while in the department.  Abdominal exam remains nonsurgical.  After consideration of the diagnostic results and the patients response to treatment, I feel that symptoms likely related to viral gastroenteritis. Emergency department workup does not suggest an emergent condition requiring admission or immediate intervention beyond what has been performed at this time. The plan is: discharge to home with prescription for Zofran, and encourage good fluid intake.. The patient is safe for discharge and has been instructed to return immediately for worsening symptoms, change in symptoms or any other concerns.  Final Clinical Impression(s) / ED Diagnoses Final diagnoses:  Gastroenteritis  Nausea and vomiting, unspecified vomiting type    Rx / DC Orders ED Discharge Orders          Ordered    ondansetron (ZOFRAN) 4 MG tablet  Every 6 hours        03/21/22 1150           Portions of this report may have been transcribed using voice recognition software. Every effort was made to ensure accuracy; however, inadvertent computerized transcription errors may be present.    Su Monks, PA-C 03/21/22 1154    Virgina Norfolk, DO 03/21/22 1325

## 2022-03-21 NOTE — ED Notes (Signed)
Client states he feels much better after treatment here in the ED, states he no longer is having the abd cramps that initially occurring upon arrival to the ED today.
